# Patient Record
Sex: Female | Born: 1980 | Race: Black or African American | Hispanic: No | Marital: Single | Smoking: Never smoker
Health system: Southern US, Community
[De-identification: ages and names within clinical notes are randomized; demographics above are authoritative.]

## PROBLEM LIST (undated history)

## (undated) DIAGNOSIS — T4145XA Adverse effect of unspecified anesthetic, initial encounter: Secondary | ICD-10-CM

## (undated) DIAGNOSIS — C801 Malignant (primary) neoplasm, unspecified: Secondary | ICD-10-CM

## (undated) DIAGNOSIS — T8859XA Other complications of anesthesia, initial encounter: Secondary | ICD-10-CM

## (undated) DIAGNOSIS — D649 Anemia, unspecified: Secondary | ICD-10-CM

## (undated) DIAGNOSIS — I10 Essential (primary) hypertension: Secondary | ICD-10-CM

## (undated) DIAGNOSIS — R180 Malignant ascites: Secondary | ICD-10-CM

## (undated) HISTORY — DX: Malignant ascites: R18.0

## (undated) HISTORY — DX: Anemia, unspecified: D64.9

## (undated) HISTORY — DX: Essential (primary) hypertension: I10

## (undated) HISTORY — PX: PARACENTESIS: SHX844

---
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
## 2003-10-09 HISTORY — PX: WISDOM TOOTH EXTRACTION: SHX21

## 2008-10-08 DIAGNOSIS — D649 Anemia, unspecified: Secondary | ICD-10-CM

## 2008-10-08 HISTORY — PX: UTERINE FIBROID SURGERY: SHX826

## 2008-10-08 HISTORY — DX: Anemia, unspecified: D64.9

## 2009-07-22 ENCOUNTER — Ambulatory Visit: Payer: Self-pay | Admitting: Oncology

## 2009-07-26 LAB — CMP (CANCER CENTER ONLY)
AST: 22 U/L (ref 11–38)
BUN, Bld: 9 mg/dL (ref 7–22)
Calcium: 8.9 mg/dL (ref 8.0–10.3)
Chloride: 106 mEq/L (ref 98–108)
Creat: 0.8 mg/dl (ref 0.6–1.2)
Total Bilirubin: 0.8 mg/dl (ref 0.20–1.60)

## 2009-07-26 LAB — CBC WITH DIFFERENTIAL (CANCER CENTER ONLY)
BASO#: 0 10*3/uL (ref 0.0–0.2)
EOS%: 4.9 % (ref 0.0–7.0)
Eosinophils Absolute: 0.3 10*3/uL (ref 0.0–0.5)
HCT: 25.8 % — ABNORMAL LOW (ref 34.8–46.6)
HGB: 8 g/dL — ABNORMAL LOW (ref 11.6–15.9)
LYMPH%: 27.9 % (ref 14.0–48.0)
MCH: 21.2 pg — ABNORMAL LOW (ref 26.0–34.0)
MCHC: 30.8 g/dL — ABNORMAL LOW (ref 32.0–36.0)
MCV: 69 fL — ABNORMAL LOW (ref 81–101)
MONO%: 6.3 % (ref 0.0–13.0)
NEUT#: 3.2 10*3/uL (ref 1.5–6.5)
NEUT%: 60.6 % (ref 39.6–80.0)
RBC: 3.76 10*6/uL (ref 3.70–5.32)

## 2009-07-28 LAB — IRON AND TIBC
%SAT: 7 % — ABNORMAL LOW (ref 20–55)
TIBC: 372 ug/dL (ref 250–470)

## 2009-07-28 LAB — PROTEIN ELECTROPHORESIS, SERUM
Alpha-2-Globulin: 12.1 % — ABNORMAL HIGH (ref 7.1–11.8)
Beta Globulin: 7.1 % (ref 4.7–7.2)
Gamma Globulin: 16 % (ref 11.1–18.8)

## 2009-07-28 LAB — FOLATE: Folate: 7.1 ng/mL

## 2009-08-23 ENCOUNTER — Ambulatory Visit: Payer: Self-pay | Admitting: Oncology

## 2009-09-16 LAB — CBC WITH DIFFERENTIAL (CANCER CENTER ONLY)
BASO#: 0 10*3/uL (ref 0.0–0.2)
EOS%: 2.5 % (ref 0.0–7.0)
Eosinophils Absolute: 0.2 10*3/uL (ref 0.0–0.5)
LYMPH#: 1.9 10*3/uL (ref 0.9–3.3)
MCH: 24.7 pg — ABNORMAL LOW (ref 26.0–34.0)
MCV: 79 fL — ABNORMAL LOW (ref 81–101)
MONO#: 0.4 10*3/uL (ref 0.1–0.9)
NEUT#: 3.9 10*3/uL (ref 1.5–6.5)
NEUT%: 61.7 % (ref 39.6–80.0)
RDW: 24.9 % — ABNORMAL HIGH (ref 10.5–14.6)
WBC: 6.4 10*3/uL (ref 3.9–10.0)

## 2009-09-16 LAB — IRON AND TIBC
Iron: 64 ug/dL (ref 42–145)
TIBC: 279 ug/dL (ref 250–470)
UIBC: 215 ug/dL

## 2009-09-16 LAB — FERRITIN: Ferritin: 139 ng/mL (ref 10–291)

## 2009-09-22 ENCOUNTER — Inpatient Hospital Stay (HOSPITAL_COMMUNITY): Admission: RE | Admit: 2009-09-22 | Discharge: 2009-09-24 | Payer: Self-pay | Admitting: Obstetrics and Gynecology

## 2009-09-23 ENCOUNTER — Ambulatory Visit: Payer: Self-pay | Admitting: Oncology

## 2009-10-18 LAB — CBC WITH DIFFERENTIAL (CANCER CENTER ONLY)
BASO#: 0 10*3/uL (ref 0.0–0.2)
BASO%: 0.4 % (ref 0.0–2.0)
EOS%: 4.3 % (ref 0.0–7.0)
HCT: 37.5 % (ref 34.8–46.6)
HGB: 12.2 g/dL (ref 11.6–15.9)
LYMPH%: 28.2 % (ref 14.0–48.0)
MCHC: 32.6 g/dL (ref 32.0–36.0)
MCV: 82 fL (ref 81–101)
NEUT%: 61.6 % (ref 39.6–80.0)
RDW: 18.8 % — ABNORMAL HIGH (ref 10.5–14.6)

## 2009-10-18 LAB — IRON AND TIBC: %SAT: 39 % (ref 20–55)

## 2010-01-25 ENCOUNTER — Ambulatory Visit: Payer: Self-pay | Admitting: Oncology

## 2010-01-31 LAB — CBC WITH DIFFERENTIAL (CANCER CENTER ONLY)
BASO#: 0 10*3/uL (ref 0.0–0.2)
Eosinophils Absolute: 0.3 10*3/uL (ref 0.0–0.5)
HGB: 12.7 g/dL (ref 11.6–15.9)
MCH: 29.6 pg (ref 26.0–34.0)
MCV: 86 fL (ref 81–101)
MONO#: 0.3 10*3/uL (ref 0.1–0.9)
MONO%: 5.2 % (ref 0.0–13.0)
NEUT#: 3.6 10*3/uL (ref 1.5–6.5)
Platelets: 218 10*3/uL (ref 145–400)
RBC: 4.28 10*6/uL (ref 3.70–5.32)
WBC: 6.1 10*3/uL (ref 3.9–10.0)

## 2010-01-31 LAB — IRON AND TIBC
%SAT: 18 % — ABNORMAL LOW (ref 20–55)
Iron: 42 ug/dL (ref 42–145)
UIBC: 196 ug/dL

## 2010-01-31 LAB — FERRITIN: Ferritin: 123 ng/mL (ref 10–291)

## 2011-01-08 LAB — CBC
HCT: 37.3 % (ref 36.0–46.0)
Platelets: 258 10*3/uL (ref 150–400)
RDW: 24.5 % — ABNORMAL HIGH (ref 11.5–15.5)
WBC: 14.5 10*3/uL — ABNORMAL HIGH (ref 4.0–10.5)

## 2011-01-09 LAB — PREGNANCY, URINE: Preg Test, Ur: NEGATIVE

## 2011-01-09 LAB — CBC
HCT: 40.4 % (ref 36.0–46.0)
Hemoglobin: 12.9 g/dL (ref 12.0–15.0)
RBC: 5.01 MIL/uL (ref 3.87–5.11)
WBC: 8.1 10*3/uL (ref 4.0–10.5)

## 2011-03-26 ENCOUNTER — Emergency Department (HOSPITAL_BASED_OUTPATIENT_CLINIC_OR_DEPARTMENT_OTHER)
Admission: EM | Admit: 2011-03-26 | Discharge: 2011-03-26 | Disposition: A | Discharge status: Home / Self Care | Payer: PRIVATE HEALTH INSURANCE | Attending: Emergency Medicine | Admitting: Emergency Medicine

## 2011-03-26 ENCOUNTER — Emergency Department (INDEPENDENT_AMBULATORY_CARE_PROVIDER_SITE_OTHER): Payer: PRIVATE HEALTH INSURANCE

## 2011-03-26 DIAGNOSIS — R079 Chest pain, unspecified: Secondary | ICD-10-CM | POA: Insufficient documentation

## 2011-03-26 DIAGNOSIS — I10 Essential (primary) hypertension: Secondary | ICD-10-CM | POA: Insufficient documentation

## 2011-03-26 LAB — COMPREHENSIVE METABOLIC PANEL
BUN: 12 mg/dL (ref 6–23)
Calcium: 9.5 mg/dL (ref 8.4–10.5)
Creatinine, Ser: 0.8 mg/dL (ref 0.50–1.10)
GFR calc Af Amer: >60 mL/min (ref >60)
Glucose, Bld: 94 mg/dL (ref 70–99)
Total Protein: 7.5 g/dL (ref 6.0–8.3)

## 2011-03-26 LAB — DIFFERENTIAL
Basophils Absolute: 0 10*3/uL (ref 0.0–0.1)
Basophils Relative: 0 % (ref 0–1)
Eosinophils Relative: 1 % (ref 0–5)
Monocytes Absolute: 0.5 10*3/uL (ref 0.1–1.0)
Neutro Abs: 6.8 10*3/uL (ref 1.7–7.7)

## 2011-03-26 LAB — CBC
MCHC: 33.9 g/dL (ref 30.0–36.0)
RDW: 13.3 % (ref 11.5–15.5)
WBC: 8.8 10*3/uL (ref 4.0–10.5)

## 2011-03-26 LAB — CK TOTAL AND CKMB (NOT AT ARMC)
CK, MB: 2.7 ng/mL (ref 0.3–4.0)
Relative Index: 0.6 (ref 0.0–2.5)
Total CK: 478 U/L — ABNORMAL HIGH (ref 7–177)

## 2011-03-29 ENCOUNTER — Encounter: Payer: Self-pay | Admitting: Cardiovascular Disease

## 2011-03-30 ENCOUNTER — Ambulatory Visit (INDEPENDENT_AMBULATORY_CARE_PROVIDER_SITE_OTHER): Payer: PRIVATE HEALTH INSURANCE | Admitting: Cardiovascular Disease

## 2011-03-30 ENCOUNTER — Encounter: Payer: Self-pay | Admitting: Cardiovascular Disease

## 2011-03-30 VITALS — BP 122/92 | HR 64 | Ht 67.0 in | Wt 264.0 lb

## 2011-03-30 DIAGNOSIS — R0789 Other chest pain: Secondary | ICD-10-CM | POA: Insufficient documentation

## 2011-03-30 NOTE — Assessment & Plan Note (Signed)
Her pain is atypical and most likely non-cardiac. Her only risk factor for CAD is HTN. She is a non-smoker. This sounds like a GI related problem. Most likely GERD or possibly hiatal hernia. She will be seen in primary care later today. I have recommended a proton pump inhibitor but we have no samples today. I am asking her to get this in primary care. She may need full GI workup if the PPI does not help her symptoms. No further cardiac workup is necessary.

## 2011-03-30 NOTE — Progress Notes (Signed)
History of Present Illness:30 yo AAF with history of anemia, uterine fibroids, HTN who is here today to establish cardiology care. She was seen in the [facility] on March 26, 2011. She had been at the [employer] working out. She works out every day. While working out she developed chest pain. No SOB, diaphoresis, nausea, vomiting. She got a sensation that she could not swallow. She was taken to the ED for evaluation and had a normal chest xray and normal cardiac enzymes. She has had no recurrence of these symptoms over the last four days. She is a non-smoker. There is no family history of coronary artery disease. No illicit drug use.   Past Medical History  Diagnosis Date  . Hypertension   . Anemia     Past Surgical History  Procedure Date  . Uterine fibroid surgery 2010    Current Outpatient Prescriptions  Medication Sig Dispense Refill  . aspirin 81 MG chewable tablet Chew 81 mg by mouth daily.        . hydrochlorothiazide (,MICROZIDE/HYDRODIURIL,) 12.5 MG capsule Take 25 mg by mouth daily.         No Known Allergies  History   Social History  . Marital Status: Single    Spouse Name: N/A    Number of Children: N/A  . Years of Education: N/A   Occupational History  . Not on file.   Social History Main Topics  . Smoking status: Never Smoker   . Smokeless tobacco: Not on file  . Alcohol Use: No  . Drug Use: No  . Sexually Active: Not on file   Other Topics Concern  . Not on file   Social History Narrative  . No narrative on file    Family History  Problem Relation Age of Onset  . Cancer Father   . Breast cancer Mother   . Depression Father     Review of Systems:  As stated in the HPI and otherwise negative.   BP 122/92  Pulse 64  Ht 5\' 7"  (1.702 m)  Wt 264 lb (119.75 kg)  BMI 41.35 kg/m2  Physical Examination: General: Well developed, well nourished, NAD HEENT: OP clear, mucus membranes moist SKIN: warm, dry. No rashes. Neuro: No focal  deficits Musculoskeletal: Muscle strength 5/5 all ext Psychiatric: Mood and affect normal Neck: No JVD, no carotid bruits, no thyromegaly, no lymphadenopathy. Lungs:Clear bilaterally, no wheezes, rhonci, crackles Cardiovascular: Regular rate and rhythm. No murmurs, gallops or rubs. Abdomen:Soft. Bowel sounds present. Non-tender.  Extremities: No lower extremity edema. Pulses are 2 + in the bilateral DP/PT.  EKG:

## 2012-04-30 ENCOUNTER — Other Ambulatory Visit: Payer: Self-pay | Admitting: Obstetrics and Gynecology

## 2012-04-30 ENCOUNTER — Other Ambulatory Visit (HOSPITAL_COMMUNITY): Payer: PRIVATE HEALTH INSURANCE

## 2012-04-30 DIAGNOSIS — R110 Nausea: Secondary | ICD-10-CM

## 2012-04-30 DIAGNOSIS — R1032 Left lower quadrant pain: Secondary | ICD-10-CM

## 2012-05-02 ENCOUNTER — Ambulatory Visit (HOSPITAL_COMMUNITY)
Admission: RE | Admit: 2012-05-02 | Discharge: 2012-05-02 | Disposition: A | Discharge status: Home / Self Care | Payer: 59 | Source: Ambulatory Visit | Attending: Obstetrics and Gynecology | Admitting: Obstetrics and Gynecology

## 2012-05-02 DIAGNOSIS — R1032 Left lower quadrant pain: Secondary | ICD-10-CM

## 2012-05-02 DIAGNOSIS — R109 Unspecified abdominal pain: Secondary | ICD-10-CM | POA: Insufficient documentation

## 2012-05-02 DIAGNOSIS — R188 Other ascites: Secondary | ICD-10-CM | POA: Insufficient documentation

## 2012-05-02 DIAGNOSIS — R141 Gas pain: Secondary | ICD-10-CM | POA: Insufficient documentation

## 2012-05-02 DIAGNOSIS — R143 Flatulence: Secondary | ICD-10-CM | POA: Insufficient documentation

## 2012-05-02 DIAGNOSIS — R599 Enlarged lymph nodes, unspecified: Secondary | ICD-10-CM | POA: Insufficient documentation

## 2012-05-02 DIAGNOSIS — R142 Eructation: Secondary | ICD-10-CM | POA: Insufficient documentation

## 2012-05-02 DIAGNOSIS — R9389 Abnormal findings on diagnostic imaging of other specified body structures: Secondary | ICD-10-CM | POA: Insufficient documentation

## 2012-05-02 DIAGNOSIS — R110 Nausea: Secondary | ICD-10-CM

## 2012-05-02 MED ORDER — GADOBENATE DIMEGLUMINE 529 MG/ML IV SOLN
20.0000 mL | Freq: Once | INTRAVENOUS | Status: AC | PRN
  Administered 2012-05-02: 20 mL via INTRAVENOUS

## 2012-05-07 ENCOUNTER — Ambulatory Visit: Payer: 59 | Attending: Gynecologic Oncology | Admitting: Gynecologic Oncology

## 2012-05-07 ENCOUNTER — Encounter: Payer: Self-pay | Admitting: Gynecologic Oncology

## 2012-05-07 ENCOUNTER — Other Ambulatory Visit (HOSPITAL_COMMUNITY)
Admission: RE | Admit: 2012-05-07 | Discharge: 2012-05-07 | Disposition: A | Discharge status: Home / Self Care | Payer: 59 | Source: Ambulatory Visit | Attending: Gynecologic Oncology | Admitting: Gynecologic Oncology

## 2012-05-07 ENCOUNTER — Ambulatory Visit: Payer: 59 | Admitting: Lab

## 2012-05-07 VITALS — BP 118/68 | HR 98 | Temp 97.5°F | Resp 22 | Ht 68.19 in | Wt 272.8 lb

## 2012-05-07 DIAGNOSIS — R180 Malignant ascites: Secondary | ICD-10-CM | POA: Insufficient documentation

## 2012-05-07 DIAGNOSIS — R188 Other ascites: Secondary | ICD-10-CM

## 2012-05-07 DIAGNOSIS — D259 Leiomyoma of uterus, unspecified: Secondary | ICD-10-CM | POA: Insufficient documentation

## 2012-05-07 DIAGNOSIS — Z01419 Encounter for gynecological examination (general) (routine) without abnormal findings: Secondary | ICD-10-CM | POA: Insufficient documentation

## 2012-05-07 HISTORY — PX: OTHER SURGICAL HISTORY: SHX169

## 2012-05-07 LAB — HCG, QUANTITATIVE, PREGNANCY: hCG, Beta Chain, Quant, S: <2 m[IU]/mL

## 2012-05-07 LAB — CA 125: CA 125: 55.9 U/mL — ABNORMAL HIGH (ref 0.0–30.2)

## 2012-05-07 NOTE — Progress Notes (Signed)
Consult Note: Gyn-Onc  Melanie Horn 31 y.o. female  CC:  Chief Complaint  Patient presents with  . Ascites    New Consult    HPI: Patient is seen today in consultation at the request of Dr. Cherly Hensen.  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 She underwent a myomectomy in December of 2010 for uterine fibroids. Pathology is available for that and it was consistent with benign myomas.  She did well on even though there was 3 small residual fibroids until January of this year. At that time she began having some increasing dysmenorrhea. An ultrasound was performed to Dr. Cherly Hensen in the office on December 08, 2011. It revealed the endometrium to be 8 mm. She had normal appearing you ovaries. The uterus measured 12.8 x 9.1 x 8 cm with the uterine fibroids each being approximately 3 cm in size. The ovaries were normal and there is no free fluid. She progressively had increasing symptoms of bloating with her menses in this past month noted some fairly significant abdominal girth increase which was new for her. She also began having some left-sided pain as well as some burning and numbness on the right. She began having some shortness of breath with walking which was an issue as she is a Engineer, civil (consulting). Her last cycle was July 10.   With this cycle,  she has had increasing blood clots with spotting. She was seen by Dr. Cherly Hensen at which time Pap smear was performed that was unsatisfactory. She had an MRI of the pelvis done on July 26 which revealed large volume abdominal pelvic ascites with enlarged retroperitoneal and pelvic sidewall lymph nodes. The peritoneal membranes demonstrated diffuse irregular enhancement consistent with extensive omental and peritoneal surface disease. There was marked the regular thickening and expansion of the endometrial cavity with heterogeneous contrast c/w endometrial cancer. It appeared to invade the anterior myometrium. These results  were to discussed with the patient by Dr. Cherly Hensen on Friday. Because of her discomfort she was recommended to go in [facility] emergency room she did. She had a CT scan on July 28 at the emergency room that revealed the mediastinum, the heart and the lungs to be normal. The liver had a steatotic appearance with hepatomegaly with a span of 19 cm. The spleen, pancreas, adrenals, kidneys were normal. She had omental cake large volume of ascites. No dependent positive abnormal soft tissue within the peritoneal cavity. Are normal bilaterally. Within the pelvis she had a cystic lesion of the right adnexa. This was favored to be ovary measuring 4.6 x 2.0 cm. The uterus was enlarged with several fibroids including a dimensions of 11 x 9 x 9 cm. It was the feeling of the emergency room in [facility] that she had metastatic ovarian cancer. She was noted to be anemic with a hematocrit of 28.4. Electrolytes were normal. Tumor markers were drawn but not available to [place] currently.   Review of Systems: By mouth intake secondary to abdominal bloating and early satiety. No significant change in her bowel or bladder habits she has not had a bowel movement since Sunday. She is mostly drinking liquids. She is taking Zofran for nausea. She did have some shortness of breath walking due to her large volume ascites. She did have an episode of chest pain in June of 2012 and was evaluated by cardiologists. Was felt to be most likely noncardiogenic. It was felt was most likely to be GI related.  She is complaining of a sore throat when she swallows.  Current Meds:  Outpatient Encounter Prescriptions as of 05/07/2012  Medication Sig Dispense Refill  . hydrochlorothiazide (,MICROZIDE/HYDRODIURIL,) 12.5 MG capsule Take 25 mg by mouth daily.       Marland Kitchen HYDROcodone-acetaminophen (NORCO/VICODIN) 5-325 MG per tablet Take 2 tablets by mouth every 6 (six) hours as needed.      Marland Kitchen ibuprofen (ADVIL,MOTRIN) 600 MG tablet Take 600 mg by mouth  every 6 (six) hours as needed.      Marland Kitchen ibuprofen (ADVIL,MOTRIN) 800 MG tablet Take 800 mg by mouth every 8 (eight) hours as needed.      . ondansetron (ZOFRAN) 8 MG tablet Take by mouth every 8 (eight) hours as needed.      Marland Kitchen aspirin 81 MG chewable tablet Chew 81 mg by mouth daily.          Allergy: No Known Allergies  Social Hx:   History   Social History  . Marital Status: Single    Spouse Name: N/A    Number of Children: N/A  . Years of Education: N/A   Occupational History  . Not on file.   Social History Main Topics  . Smoking status: Never Smoker   . Smokeless tobacco: Not on file  . Alcohol Use: No  . Drug Use: No  . Sexually Active: Not on file   Other Topics Concern  . Not on file   Social History Narrative  . No narrative on file    Past Surgical Hx:  Past Surgical History  Procedure Date  . Uterine fibroid surgery 2010    Past Medical Hx:  Past Medical History  Diagnosis Date  . Hypertension   . Anemia     Family Hx:  Family History  Problem Relation Age of Onset  . Cancer Father   . Depression Father   . Breast cancer Mother   . Hypertension Mother   . Diabetes Brother     Vitals:  Blood pressure 118/68, pulse 98, temperature 97.5 F (36.4 C), resp. rate 22, height 5' 8.19" (1.732 m), weight 272 lb 12.8 oz (123.741 kg), last menstrual period 04/16/2012.  Physical Exam: Well-nourished well-developed female in no acute distress.  Neck: Supple, no lymphadenopathy no thyromegaly.  Lungs: Shallow breath sounds clear to auscultation but he.  Cardiovascular: Tachycardic regular rhythm. No murmur.  Abdomen: Morbidly obese. Significantly distended with ascites positive fluid wave. No appreciable masses but exam is limited by ascites in habitus.  Groins: No lymphadenopathy.  Extremities: No edema.  Pelvic: External genitalia within normal limits. The cervix is nulliparous. There's a physiologic discharge was slight menstrual flow. There are  no visible lesions. ThinPrep Pap smear was submitted without difficulty. After obtaining the patient's verbal consent an endometrial biopsy was performed. There was no need for tenaculum. The uterus sounded to 10 cm. Large amount of blood and bright tissue were obtained. She tolerated it well. Bimanual examination is limited secondary to her habitus and the massive ascites. The cervix is palpably normal. There is no nodularity in the rectovaginal septum.  Assessment/Plan:  31 year old gravida 0 with a long history of uterine fibroids who has imaging consistent with carcinomatosis and ascites. The etiology is unclear. 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 I had a discussion with the patient her mother and several friends and her sister today. I will  proceed with the following.  #1: Followup in results of her Pap smear enter endometrial biopsy these are being sent rash.  #2: She will have tumor markers including a CA 125, CEA, AFP, HCG., LDH  #3: She is scheduled for a high-volume paracentesis on Friday in radiology. We will have the volume of fluid sent to pathology so the cell block and created and we can try to figure out the etiology of her malignancy.   We have in a very generic terms discussed the possibility of surgery with Dr. Loree Fee on August 20. I discussed with her that a paracentesis will make her feel better. We did discuss if this is a gynecologic primary that she will require surgery including potentially a hysterectomy. She has been thinking about this and will be fine if she had to go undergo definitive surgery and would not be able to have children. She knows that we will be calling her as we begin getting information back with results so that we can narrow down the diagnosis. She has asked that we keep Dr. Cherly Hensen informed. I discussed  with her that we will do so and that Dr. Cherly Hensen also can review the results in the computer system.  Her questions as well as those of her family and friends were answered to their satisfaction. She has her business card and will call us if there's any change in her status prior to be able to communicate with her.  She is pleased with her consultation today and that plans are being developed. Cleda Mccreedy A., MD 05/07/2012, 4:21 PM

## 2012-05-07 NOTE — Patient Instructions (Signed)
Return to radiology for paracentesis on Friday.

## 2012-05-08 ENCOUNTER — Telehealth: Payer: Self-pay | Admitting: *Deleted

## 2012-05-08 NOTE — Telephone Encounter (Signed)
Patient notified of lab and preliminary (stains pending) Path results.  Surgery to be scheduled 05/27/12.  She is in agrement. Same noted to Dr Duard Brady

## 2012-05-09 ENCOUNTER — Ambulatory Visit (HOSPITAL_COMMUNITY)
Admission: RE | Admit: 2012-05-09 | Discharge: 2012-05-09 | Disposition: A | Discharge status: Home / Self Care | Payer: 59 | Source: Ambulatory Visit | Attending: Gynecologic Oncology | Admitting: Gynecologic Oncology

## 2012-05-09 VITALS — BP 136/73

## 2012-05-09 DIAGNOSIS — R180 Malignant ascites: Secondary | ICD-10-CM | POA: Insufficient documentation

## 2012-05-09 DIAGNOSIS — R188 Other ascites: Secondary | ICD-10-CM

## 2012-05-09 DIAGNOSIS — C801 Malignant (primary) neoplasm, unspecified: Secondary | ICD-10-CM | POA: Insufficient documentation

## 2012-05-09 NOTE — Procedures (Signed)
US guided diagnostic/therapeutic paracentesis performed yielding 6 liters turbid, amber fluid. The fluid was submitted for cytology. No immediate complications.

## 2012-05-13 ENCOUNTER — Encounter: Payer: Self-pay | Admitting: Internal Medicine

## 2012-05-13 ENCOUNTER — Ambulatory Visit (INDEPENDENT_AMBULATORY_CARE_PROVIDER_SITE_OTHER): Payer: 59 | Admitting: Internal Medicine

## 2012-05-13 VITALS — BP 98/60 | HR 110 | Temp 97.7°F | Ht 68.0 in | Wt 259.6 lb

## 2012-05-13 DIAGNOSIS — D649 Anemia, unspecified: Secondary | ICD-10-CM

## 2012-05-13 DIAGNOSIS — G4700 Insomnia, unspecified: Secondary | ICD-10-CM | POA: Insufficient documentation

## 2012-05-13 DIAGNOSIS — R180 Malignant ascites: Secondary | ICD-10-CM

## 2012-05-13 DIAGNOSIS — I10 Essential (primary) hypertension: Secondary | ICD-10-CM

## 2012-05-13 MED ORDER — ZOLPIDEM TARTRATE 10 MG PO TABS: ORAL_TABLET | ORAL | Status: DC

## 2012-05-13 MED ORDER — PHENOL 1.4 % MT LIQD: 1.0000 | OROMUCOSAL | Status: AC | PRN

## 2012-05-13 MED ORDER — HYDROCHLOROTHIAZIDE 12.5 MG PO CAPS: 25.0000 mg | ORAL_CAPSULE | Freq: Every day | ORAL | Status: DC

## 2012-05-13 NOTE — Progress Notes (Signed)
  Subjective:    Patient ID: Melanie Horn, female    DOB: [birth year], 31 y.o.   MRN: [id number]  HPI  This is my first visit with MS Baccari. She is here with her aunt. Please see the A&P for the status of the pt's chronic medical problems.   Review of Systems  Constitutional: Positive for activity change, appetite change and fatigue. Negative for unexpected weight change.  HENT: Positive for trouble swallowing. Negative for congestion, rhinorrhea, sneezing and postnasal drip.   Eyes: Negative for itching.  Respiratory: Negative for cough, choking, chest tightness and shortness of breath.   Cardiovascular: Negative for chest pain and leg swelling.  Gastrointestinal: Positive for nausea, constipation and abdominal distention. Negative for vomiting and diarrhea.  Genitourinary: Positive for urgency, vaginal bleeding and menstrual problem. Negative for dysuria, frequency and hematuria.  Musculoskeletal: Negative for back pain.  Skin: Negative for color change, pallor, rash and wound.  Neurological: Negative for dizziness, light-headedness and headaches.  Psychiatric/Behavioral: Positive for disturbed wake/sleep cycle.       Objective:   Physical Exam  Constitutional: She is oriented to person, place, and time. She appears well-developed and well-nourished. No distress.  HENT:  Head: Normocephalic and atraumatic.  Right Ear: External ear normal.  Left Ear: External ear normal.  Nose: Nose normal.  Mouth/Throat: Oropharynx is clear and moist.       Tongue has dark coating to it. No thrush.  Eyes: Conjunctivae and EOM are normal.  Cardiovascular: Normal rate, regular rhythm, normal heart sounds and intact distal pulses.   No murmur heard. Pulmonary/Chest: Effort normal and breath sounds normal. No respiratory distress.  Abdominal: Soft. Bowel sounds are normal.  Musculoskeletal: Normal range of motion. She exhibits edema.       Trace edema B  Neurological: She is alert and oriented  to person, place, and time.  Skin: Skin is warm and dry. No rash noted. She is not diaphoretic. No erythema. No pallor.       + ecchymosis  Psychiatric: She has a normal mood and affect. Her behavior is normal. Judgment and thought content normal.          Assessment & Plan:

## 2012-05-13 NOTE — Patient Instructions (Addendum)
Try chloraseptic spray for your throat.  Do not take your HCTZ. Keep an eye on your blood pressure and let me know if it increases to over 140/90.  Try the Ambien for sleep. Start with 1/2 pill and if not helpful, try one full pill.  Call and schedule an appointment once your cancer work up is complete.   Call anytime you have an issue.

## 2012-05-14 ENCOUNTER — Other Ambulatory Visit: Payer: Self-pay | Admitting: Radiology

## 2012-05-14 ENCOUNTER — Other Ambulatory Visit: Payer: Self-pay | Admitting: *Deleted

## 2012-05-14 ENCOUNTER — Telehealth: Payer: Self-pay | Admitting: *Deleted

## 2012-05-14 DIAGNOSIS — R180 Malignant ascites: Secondary | ICD-10-CM

## 2012-05-14 NOTE — Telephone Encounter (Signed)
Tc from pt with c/o return of ascites.  Explored surgical options @ [facility] and patient prefers to stay here in [place].  D/W dr Duard Brady and arrangements made for pt to have an Aspira drain placed tomorrow.

## 2012-05-15 ENCOUNTER — Encounter (HOSPITAL_COMMUNITY): Payer: Self-pay

## 2012-05-15 ENCOUNTER — Encounter: Payer: Self-pay | Admitting: Internal Medicine

## 2012-05-15 ENCOUNTER — Other Ambulatory Visit: Payer: Self-pay | Admitting: Gynecologic Oncology

## 2012-05-15 ENCOUNTER — Ambulatory Visit (HOSPITAL_COMMUNITY)
Admission: RE | Admit: 2012-05-15 | Discharge: 2012-05-15 | Disposition: A | Discharge status: Home / Self Care | Payer: 59 | Source: Ambulatory Visit | Attending: Gynecologic Oncology | Admitting: Gynecologic Oncology

## 2012-05-15 VITALS — BP 122/70

## 2012-05-15 DIAGNOSIS — I10 Essential (primary) hypertension: Secondary | ICD-10-CM | POA: Insufficient documentation

## 2012-05-15 DIAGNOSIS — R180 Malignant ascites: Secondary | ICD-10-CM

## 2012-05-15 DIAGNOSIS — D649 Anemia, unspecified: Secondary | ICD-10-CM | POA: Insufficient documentation

## 2012-05-15 DIAGNOSIS — C55 Malignant neoplasm of uterus, part unspecified: Secondary | ICD-10-CM | POA: Insufficient documentation

## 2012-05-15 LAB — CBC WITH DIFFERENTIAL/PLATELET
Eosinophils Relative: 0 % (ref 0–5)
HCT: 28.2 % — ABNORMAL LOW (ref 36.0–46.0)
Lymphs Abs: 1.6 10*3/uL (ref 0.7–4.0)
MCV: 67.8 fL — ABNORMAL LOW (ref 78.0–100.0)
Monocytes Relative: 5 % (ref 3–12)
Platelets: 891 10*3/uL — ABNORMAL HIGH (ref 150–400)
RBC: 4.16 MIL/uL (ref 3.87–5.11)
WBC: 26.3 10*3/uL — ABNORMAL HIGH (ref 4.0–10.5)

## 2012-05-15 LAB — PROTIME-INR
INR: 1.04 (ref 0.00–1.49)
Prothrombin Time: 13.8 seconds (ref 11.6–15.2)

## 2012-05-15 MED ORDER — CEFAZOLIN SODIUM 1-5 GM-% IV SOLN
1.0000 g | Freq: Once | INTRAVENOUS | Status: DC
  Filled 2012-05-15: qty 50

## 2012-05-15 MED ORDER — CEFAZOLIN SODIUM-DEXTROSE 2-3 GM-% IV SOLR
2.0000 g | Freq: Once | INTRAVENOUS | Status: DC
  Filled 2012-05-15: qty 50

## 2012-05-15 MED ORDER — SODIUM CHLORIDE 0.9 % IV SOLN
Freq: Once | INTRAVENOUS | Status: AC
  Administered 2012-05-15: 13:00:00 via INTRAVENOUS

## 2012-05-15 NOTE — Assessment & Plan Note (Addendum)
I reviewed what I had learned from a chart review with the pt to ensure that I had the details correct. The primary is assumed to be ovarian or endometrial. She has a surgery planned Aug 20th for a TAH +/- BSO. Surgery has already discussed fertility with her.   Due to the ascites, she has DOE, tachycardia, and ABD pain. She had 6 L removed during the Aug paracentesis and was comfortable for 2 days but the fluid is slowly re accumulating. She is using a WC for long distances 2/2 to the DOE. She is troubled by the tachycardia that has been present for 3 weeks. She can feel her heart beat even at rest. I assume that it is resulting from the abd distension compromising the lung fxn resulting in increased WOB and tachycardia. The other concern would be a PE as she has a malignancy. However, no personal or fam hx of PE. No unilateral leg edema. No CP. No sudden onset. Other more likely explanation. She has 1.5 points on the Well's criteria putting her at low prob. Therefore, will not pursue W/U. She has never had a TSH. I offered to draw it today but she preferred to consolidate it with a future lab draw to reduce the number of venipunctures. She is using hydrocodone for the pain.  She has developed constipation 2/2 the opioid. She is on Colace and had 2 small BM's yesterday. She has also increased her fluid intake to help with the constipation.  She has nausea and has only vomited once. She has phenergan that she uses at night as it is sedating and zofran for the day.   She is on [facility]. She is trying to obtain short term disability. She intends to return to work once her medical issues are stable.

## 2012-05-15 NOTE — Assessment & Plan Note (Signed)
She was Rx'd oral iron in March but stopped taking it 2/2 N/V. Her HgB is stable and her ferritin has increased from a low of 7 in 2010 to 123 2 years ago. She is undergoing gyn surgery and will need to monitor her HgB afterwards.

## 2012-05-15 NOTE — Assessment & Plan Note (Signed)
BP Readings from Last 3 Encounters:  05/13/12 98/60  05/09/12 136/73  05/07/12 118/68   She has been on HCTZ and her BP has always been well controlled. Her BP today is low although she is asymptomatic and has been out of her HCTZ for approximately one month. Therefore, I have asked her not to take her HCTZ until further notice. She will have freq interactions with the health care system over the next several months and if her BP increases, it will be picked up quickly. I did give her an RZ for her HCTZ so that she will have it when and if her BP increases enough that she needs to resume it.  Her prior MD required visits q 3 months to monitor her BP. 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

## 2012-05-15 NOTE — H&P (Signed)
Chief Complaint: Malignant ascites Referring Physician:Dr. Duard Brady HPI: Melanie Horn is an 31 y.o. female who unfortunately is diagnosis with recurrent malignant ascites and gynecological malignancy. She has had paracentesis and due to her recurrence, she is felt to be a candidate for Aspira/PLeurx drain placement. She is otherwise ok without other c/o.  Past Medical History:  Past Medical History  Diagnosis Date  . Hypertension     Treated with HCTZ monotherapy and well controlled  . Anemia 2010    HgB baseline about 12 - 13. Fe Deficiency, ferritin 7 07/2009. Cannot tolerate oral iron. Ferritin has increased to normal.  . Ascites, malignant     Work underway 03/14/12    Past Surgical History:  Past Surgical History  Procedure Date  . Uterine fibroid surgery 03-14-2009    myomectomy with benign path    Family History:  Family History  Problem Relation Age of Onset  . Cancer Father     AML. Died 03/14/2002  . Depression Father   . Breast cancer Maternal Grandmother     Died 03/14/82.  Marland Kitchen Hypertension Mother   . Diabetes Brother     Onset prior to 30's.  . Breast cancer Maternal Aunt     Maternal GREAT aunt.   . Pancreatic cancer Maternal Uncle     Social History:  reports that she has never smoked. She does not have any smokeless tobacco history on file. She reports that she does not drink alcohol or use illicit drugs.  Allergies: No Known Allergies  Medications: Colace, Microzide, Zofran, Ambien, Phenergan  Please HPI for pertinent positives, otherwise complete 10 system ROS negative.  Physical Exam: Blood pressure 118/84, pulse 107, temperature 97 F (36.1 C), temperature source Oral, resp. rate 20, last menstrual period 04/16/2012, SpO2 97.00%. There is no height or weight on file to calculate BMI.   General Appearance:  Alert, cooperative, no distress, appears stated age, morbidly obese  Head:  Normocephalic, without obvious abnormality, atraumatic  ENT: Unremarkable  Neck:  Supple, symmetrical, trachea midline, no adenopathy, thyroid: not enlarged, symmetric, no tenderness/mass/nodules  Lungs:   Clear to auscultation bilaterally, no w/r/r, respirations unlabored without use of accessory muscles.  Heart:  Regular rate and rhythm, S1, S2 normal, no murmur, rub or gallop. Carotids 2+ without bruit.  Abdomen:   Soft, non-tender, distended with ascites.  Extremities: Extremities normal, atraumatic, no cyanosis or edema  Neurologic: Normal affect, no gross deficits.   Results for orders placed during the hospital encounter of 05/15/12 (from the past 48 hour(s))  APTT     Status: Abnormal   Collection Time   05/15/12 12:42 PM      Component Value Range Comment   aPTT 22 (*) 24 - 37 seconds   CBC WITH DIFFERENTIAL     Status: Abnormal   Collection Time   05/15/12 12:42 PM      Component Value Range Comment   WBC 26.3 (*) 4.0 - 10.5 K/uL    RBC 4.16  3.87 - 5.11 MIL/uL    Hemoglobin 8.6 (*) 12.0 - 15.0 g/dL    HCT 96.0 (*) 45.4 - 46.0 %    MCV 67.8 (*) 78.0 - 100.0 fL    MCH 20.7 (*) 26.0 - 34.0 pg    MCHC 30.5  30.0 - 36.0 g/dL    RDW 09.8 (*) 11.9 - 15.5 %    Platelets 891 (*) 150 - 400 K/uL    Neutrophils Relative 89 (*) 43 - 77 %  Lymphocytes Relative 6 (*) 12 - 46 %    Monocytes Relative 5  3 - 12 %    Eosinophils Relative 0  0 - 5 %    Basophils Relative 0  0 - 1 %    Neutro Abs 23.4 (*) 1.7 - 7.7 K/uL    Lymphs Abs 1.6  0.7 - 4.0 K/uL    Monocytes Absolute 1.3 (*) 0.1 - 1.0 K/uL    Eosinophils Absolute 0.0  0.0 - 0.7 K/uL    Basophils Absolute 0.0  0.0 - 0.1 K/uL    RBC Morphology POLYCHROMASIA PRESENT      WBC Morphology TOXIC GRANULATION      Smear Review        Value: PLATELET CLUMPS NOTED ON SMEAR, COUNT APPEARS ADEQUATE  PROTIME-INR     Status: Normal   Collection Time   05/15/12 12:42 PM      Component Value Range Comment   Prothrombin Time 13.8  11.6 - 15.2 seconds    INR 1.04  0.00 - 1.49    No results  found.  Assessment/Plan Malignant ascites of gynecological source For Aspira drain placement. Labs show elevated WBC of 26k. No obvious source of infection but very concerning to place an indwelling drain. Dr. Grace Isaac contacted Telford Nab who agreed that deferring procedure until further workup for poss infection. The Aspira drain can be rescheduled once stability established. We are going to proceed with large volume paracentesis instead for therapeutic purposes, please refer to that note for details.  Brayton El PA-C 05/15/2012, 2:42 PM

## 2012-05-15 NOTE — Assessment & Plan Note (Signed)
She has never had trouble with insomnia until her current medical issues. She has used phenergan and vicoden (both for their sedating properties) to help sleep. Initially they worked but are no longer. She is unable to get a full nights sleep and is fatigued. We discussed various treatments including elavil and ambien and decided to start a trial of ambien. I gave her 10 mg tablets and asked her to start with 5 mg. I f 5 mg doesn't work, she may take 10 mg. I instructed her that she should not get OOB after taking this med as it can cause unsteadiness and lead to falls.

## 2012-05-15 NOTE — Procedures (Signed)
Successful US guided paracentesis from RLQ.  Yielded 6.2L of dark amber colored fluid.  No immediate complications.  Pt tolerated well.   Specimen was not sent for labs.  Brayton El PA-C 05/15/2012 3:39 PM

## 2012-05-16 ENCOUNTER — Encounter (HOSPITAL_COMMUNITY): Payer: Self-pay | Admitting: Pharmacy Technician

## 2012-05-16 ENCOUNTER — Other Ambulatory Visit: Payer: Self-pay | Admitting: *Deleted

## 2012-05-16 ENCOUNTER — Other Ambulatory Visit: Payer: 59

## 2012-05-16 DIAGNOSIS — C541 Malignant neoplasm of endometrium: Secondary | ICD-10-CM

## 2012-05-16 NOTE — Progress Notes (Signed)
[facility] work.

## 2012-05-19 ENCOUNTER — Ambulatory Visit: Payer: 59 | Admitting: Lab

## 2012-05-19 DIAGNOSIS — C541 Malignant neoplasm of endometrium: Secondary | ICD-10-CM

## 2012-05-19 LAB — CBC WITH DIFFERENTIAL/PLATELET
BASO%: 0.4 % (ref 0.0–2.0)
EOS%: 0.2 % (ref 0.0–7.0)
HGB: 8.7 g/dL — ABNORMAL LOW (ref 11.6–15.9)
MCH: 19.7 pg — ABNORMAL LOW (ref 25.1–34.0)
MCHC: 29.4 g/dL — ABNORMAL LOW (ref 31.5–36.0)
RDW: 17.9 % — ABNORMAL HIGH (ref 11.2–14.5)
lymph#: 1.3 10*3/uL (ref 0.9–3.3)

## 2012-05-21 ENCOUNTER — Other Ambulatory Visit: Payer: Self-pay | Admitting: *Deleted

## 2012-05-21 ENCOUNTER — Ambulatory Visit (HOSPITAL_COMMUNITY)
Admission: RE | Admit: 2012-05-21 | Discharge: 2012-05-21 | Disposition: A | Discharge status: Home / Self Care | Payer: 59 | Source: Ambulatory Visit | Attending: Gynecologic Oncology | Admitting: Gynecologic Oncology

## 2012-05-21 DIAGNOSIS — R180 Malignant ascites: Secondary | ICD-10-CM | POA: Insufficient documentation

## 2012-05-21 DIAGNOSIS — C55 Malignant neoplasm of uterus, part unspecified: Secondary | ICD-10-CM

## 2012-05-21 NOTE — Procedures (Signed)
Successful US guided paracentesis from right lateral abdomen.  Yielded 3.6L of dark yellow fluid.  No immediate complications.  Pt tolerated well.   Specimen was not sent for labs.  Brayton El PA-C 05/21/2012 2:27 PM

## 2012-05-22 ENCOUNTER — Encounter (HOSPITAL_COMMUNITY)
Admission: RE | Admit: 2012-05-22 | Discharge: 2012-05-22 | Disposition: A | Discharge status: Home / Self Care | Payer: 59 | Source: Ambulatory Visit | Attending: Obstetrics & Gynecology | Admitting: Obstetrics & Gynecology

## 2012-05-22 ENCOUNTER — Encounter (HOSPITAL_COMMUNITY): Payer: Self-pay

## 2012-05-22 DIAGNOSIS — Z01812 Encounter for preprocedural laboratory examination: Secondary | ICD-10-CM | POA: Insufficient documentation

## 2012-05-22 DIAGNOSIS — C55 Malignant neoplasm of uterus, part unspecified: Secondary | ICD-10-CM | POA: Insufficient documentation

## 2012-05-22 HISTORY — DX: Adverse effect of unspecified anesthetic, initial encounter: T41.45XA

## 2012-05-22 HISTORY — DX: Malignant (primary) neoplasm, unspecified: C80.1

## 2012-05-22 HISTORY — DX: Other complications of anesthesia, initial encounter: T88.59XA

## 2012-05-22 LAB — COMPREHENSIVE METABOLIC PANEL
AST: 19 U/L (ref 0–37)
Albumin: 2.1 g/dL — ABNORMAL LOW (ref 3.5–5.2)
Alkaline Phosphatase: 107 U/L (ref 39–117)
Chloride: 91 mEq/L — ABNORMAL LOW (ref 96–112)
Potassium: 4.3 mEq/L (ref 3.5–5.1)
Sodium: 128 mEq/L — ABNORMAL LOW (ref 135–145)
Total Bilirubin: 0.1 mg/dL — ABNORMAL LOW (ref 0.3–1.2)

## 2012-05-22 LAB — CBC WITH DIFFERENTIAL/PLATELET
Basophils Relative: 0 % (ref 0–1)
Eosinophils Relative: 1 % (ref 0–5)
Hemoglobin: 8.8 g/dL — ABNORMAL LOW (ref 12.0–15.0)
Lymphocytes Relative: 5 % — ABNORMAL LOW (ref 12–46)
Neutrophils Relative %: 89 % — ABNORMAL HIGH (ref 43–77)
RBC: 4.32 MIL/uL (ref 3.87–5.11)

## 2012-05-22 LAB — SURGICAL PCR SCREEN: Staphylococcus aureus: NEGATIVE

## 2012-05-22 LAB — HCG, SERUM, QUALITATIVE: Preg, Serum: NEGATIVE

## 2012-05-22 LAB — PREPARE RBC (CROSSMATCH)

## 2012-05-22 NOTE — Progress Notes (Signed)
Lab called me with critical Calcium level-Nancy Wilkerson,RN notified at 5:17 pm and given results-to get in touch with Dr. Duard Brady and call me back with any intervention needed if necessary.Sticker in progress note in chart.

## 2012-05-22 NOTE — Progress Notes (Signed)
AT 543 pm,Nancy Wilkerson,RN called me back after talking to Dr. Stanford Breed- he was made aware of patient coming to ER at [facility]. He and his fellow Dr. Warner Mccreedy will receive patient for GYN-Oncology services.

## 2012-05-22 NOTE — Progress Notes (Signed)
Critical lab value -platelets called to me-and called to Dagmar Hait, RN with no new orders received.Sticker in progress notes.

## 2012-05-22 NOTE — Patient Instructions (Addendum)
21 TENE GATO  05/22/2012   Your procedure is scheduled on:  Tuesday 05/27/2012 at 0715 am  Report to [facility] at 0515 AM.  Call this number if you have problems the morning of surgery: [phone]   Remember:   Do not eat food:After Midnight.  May have clear liquids:until Midnight .  Clear liquids include soda, tea, black coffee, apple or grape juice, broth.  Take these medicines the morning of surgery with A SIP OF WATER: Hydrocodone-acetaminophen if needed   Do not wear jewelry, make-up or nail polish.  Do not wear lotions, powders, or perfumes.   Do not shave 48 hours prior to surgery. Men may shave face and neck.  Do not bring valuables to the hospital.  Contacts, dentures or bridgework may not be worn into surgery.  Leave suitcase in the car. After surgery it may be brought to your room.  For patients admitted to the hospital, checkout time is 11:00 AM the day of discharge.       Special Instructions: CHG Shower Use Special Wash: 1/2 bottle night before surgery and 1/2 bottle morning of surgery.   Please read over the following fact sheets that you were given: MRSA Information, Sleep apnea sheet, Blood Transfusion sheet, Incentive Spirometry sheet, instructed on leg exercises and cough and deep breath exercses                 Please call me ,Telford Nab.Georgeanna Lea, RN, BSN at [phone] if any questions.

## 2012-05-22 NOTE — Progress Notes (Signed)
At 531 pm ,Melanie Horn called me to inform me she Had talked to patient and per Dr. Denman George recommendation, patient needed to go to the ER at [facility] ,[place] to be admitted to GYN Oncology Unit.

## 2012-05-22 NOTE — Progress Notes (Signed)
Melanie Horn called patient around 531pm and informed her she needed to go to ER at [facility] and be admitted to Dr. Stanford Breed.

## 2012-05-22 NOTE — Pre-Procedure Instructions (Signed)
Reviewed with patient per-op instructions using Teach back method.

## 2012-05-22 NOTE — Progress Notes (Signed)
Faxed 28 pages to Dr. Warner Mccreedy at [facility]-including H&P from Dr. Duard Brady, Pathology reports, Radiology reports, labs and EKG.  [facility] and they received FAXES.

## 2012-05-23 LAB — PATHOLOGIST SMEAR REVIEW

## 2012-05-25 LAB — BODY FLUID CULTURE

## 2012-05-27 ENCOUNTER — Encounter (HOSPITAL_COMMUNITY): Admission: RE | Payer: Self-pay | Source: Ambulatory Visit

## 2012-05-27 ENCOUNTER — Ambulatory Visit (HOSPITAL_COMMUNITY): Admission: RE | Admit: 2012-05-27 | Payer: 59 | Source: Ambulatory Visit | Admitting: Obstetrics & Gynecology

## 2012-05-27 SURGERY — HYSTERECTOMY, ABDOMINAL
Anesthesia: General

## 2012-05-31 LAB — TYPE AND SCREEN
Antibody Screen: NEGATIVE
Unit division: 0

## 2012-06-19 ENCOUNTER — Ambulatory Visit: Payer: 59 | Admitting: Gynecologic Oncology

## 2012-07-08 DEATH — deceased

## 2013-07-08 DEATH — deceased

## 2014-01-12 IMAGING — CR DG CHEST 2V
3 series · 3 of 3 positions shown · non-contrast
Comparison: 03/26/2011

CLINICAL DATA: Shortness of breath.  Uterine cancer.  Rule out
pleural fluid.

CHEST - 2 VIEW

[w chest lat *]
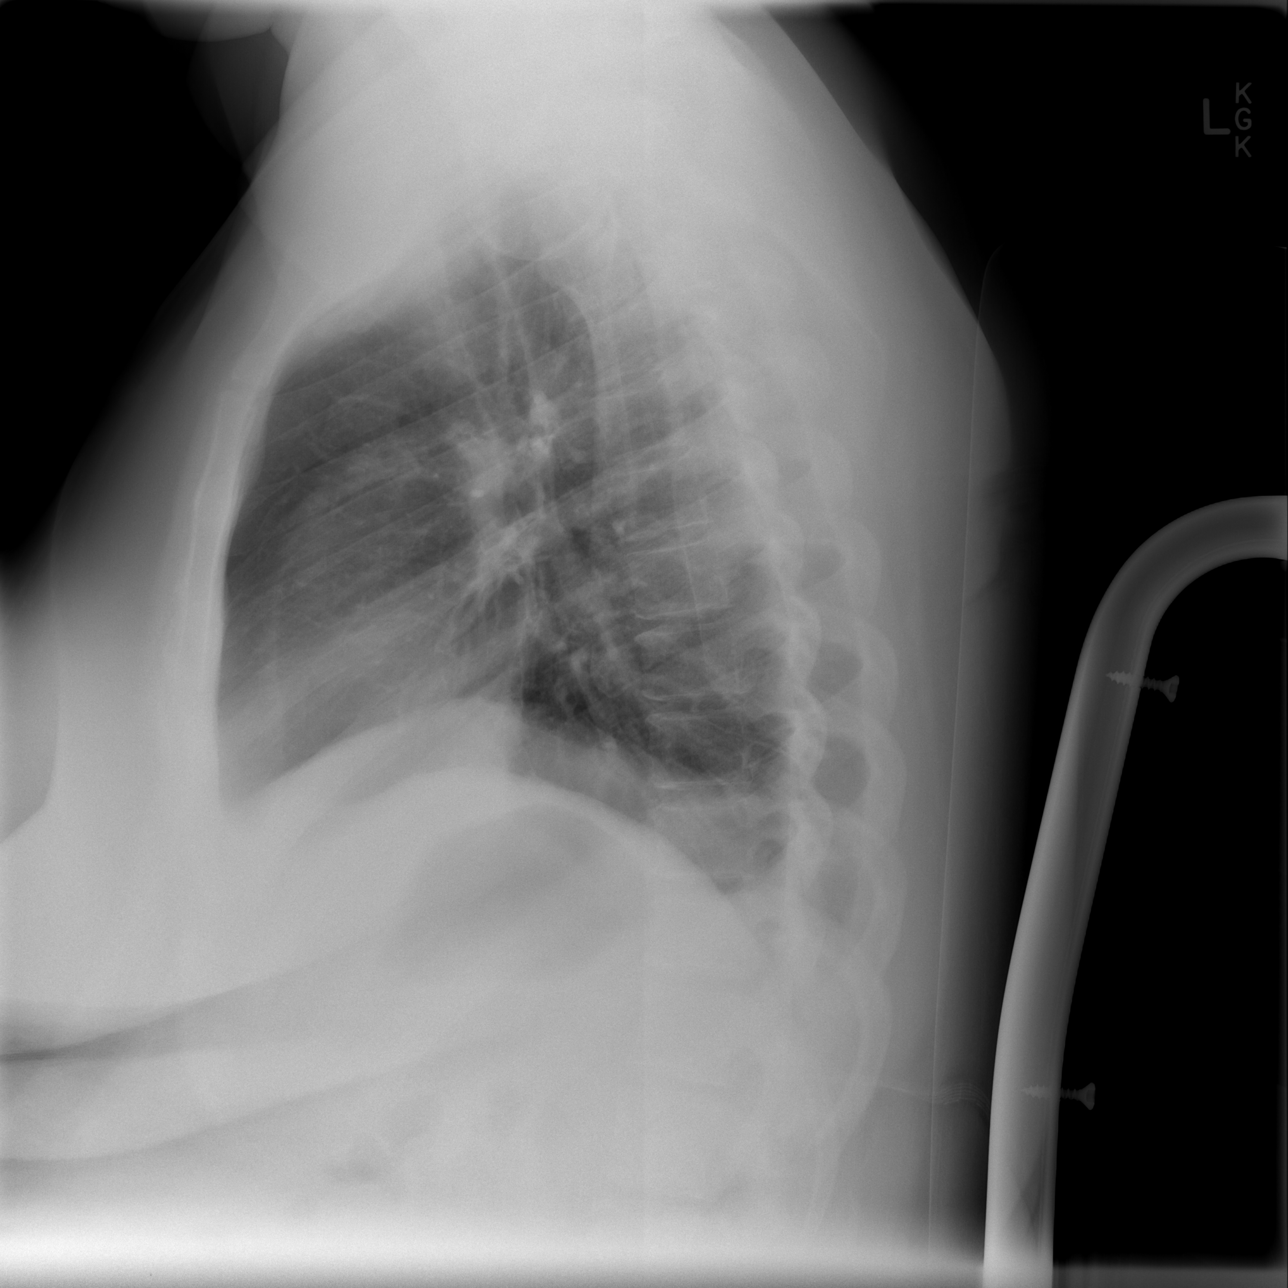

[view not recorded (1 of 2)]
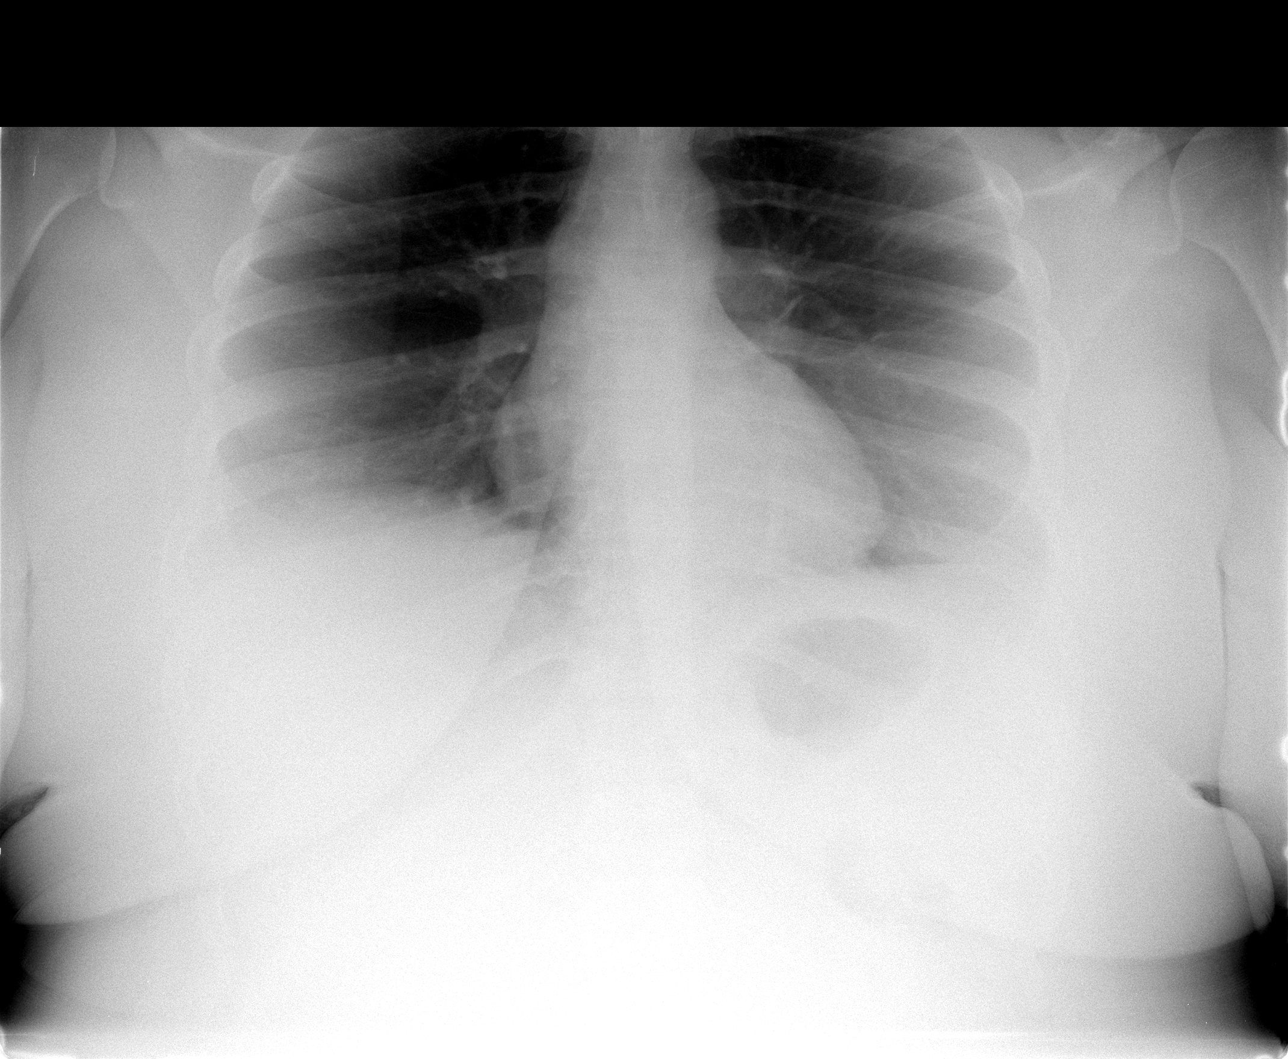

[view not recorded (2 of 2)]
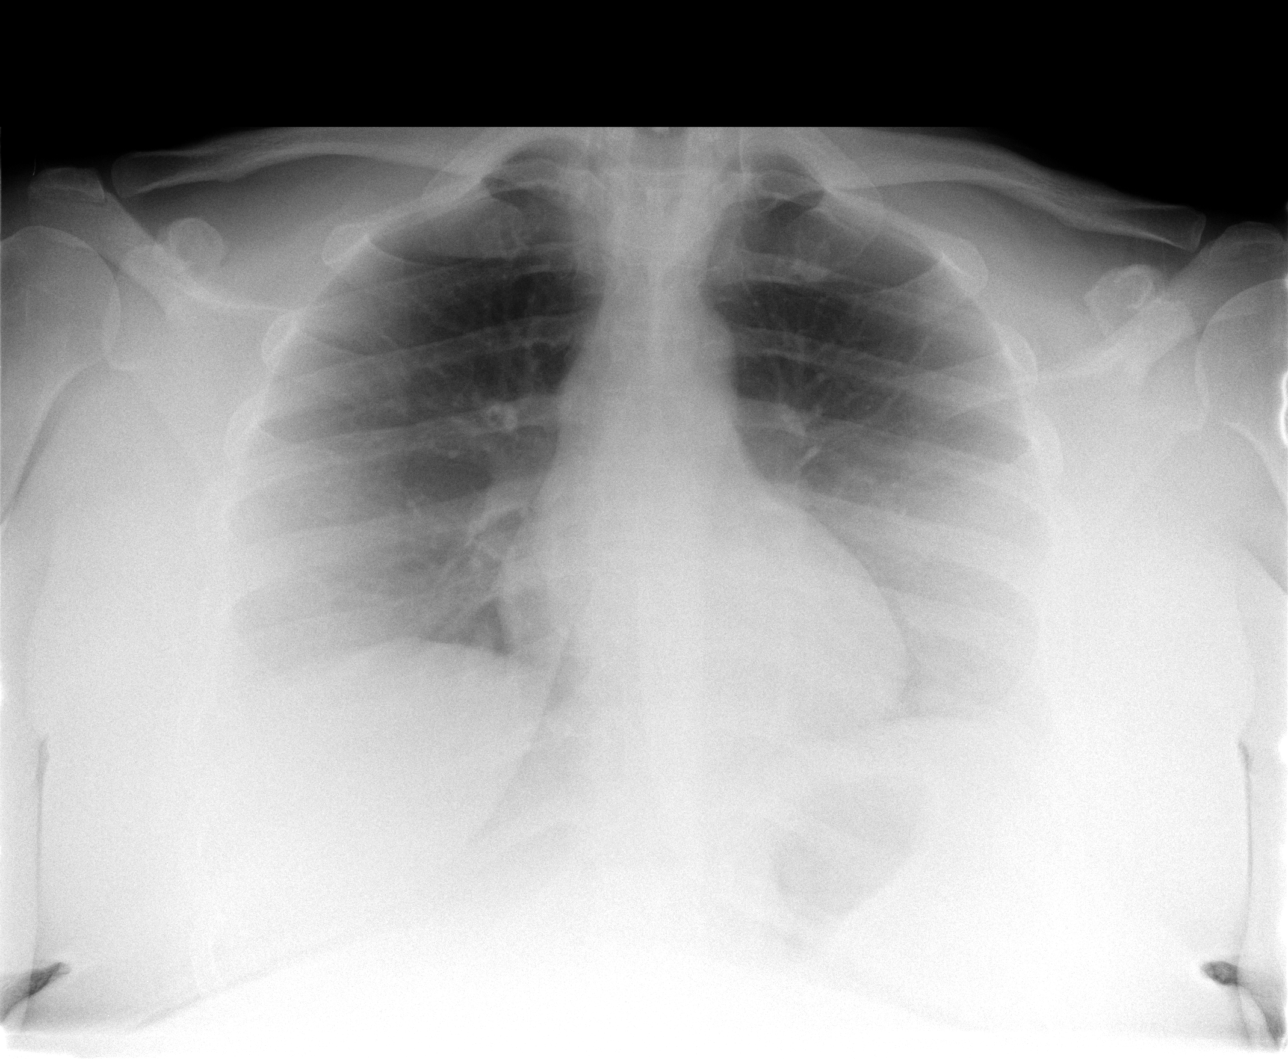

[3 of 3 positions shown; findings below may reference images not displayed]

FINDINGS: The frontal view is degraded by AP portable technique and
patient body habitus. Midline trachea.  Normal heart size for level
of inspiration.  There may be trace right-sided pleural fluid or
thickening on the lateral view. No pneumothorax.  Low lung volumes
with resultant pulmonary interstitial prominence.  Mild bibasilar
atelectasis.
IMPRESSION: No acute cardiopulmonary disease.  Possible trace right-sided
pleural fluid or thickening.  Degraded frontal exam.

## 2014-01-12 IMAGING — US US PARACENTESIS
1 series · 5 of 5 positions shown · non-contrast
Comparison: Previous paracentesis

CLINICAL DATA: Uterine sarcoma, recurrent malignant ascites

ULTRASOUND GUIDED PARACENTESIS

[Series 1: us paracentesis · 0.39mm/px · 5 of 5 slices shown]
[im 1/5]
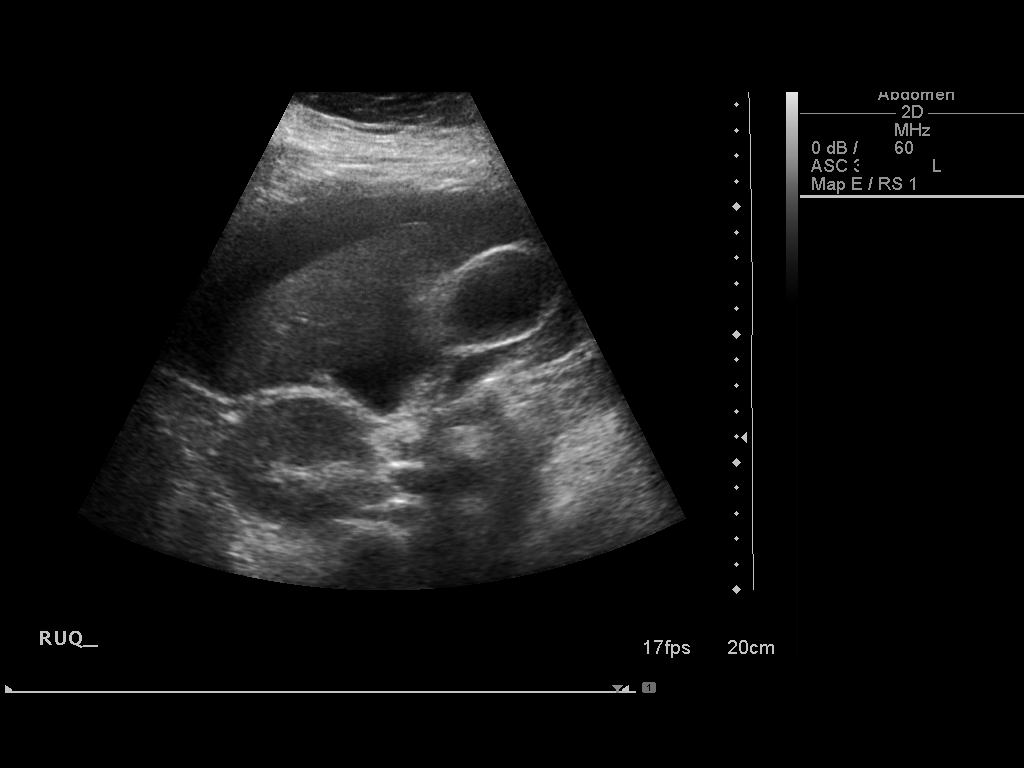
[im 2/5]
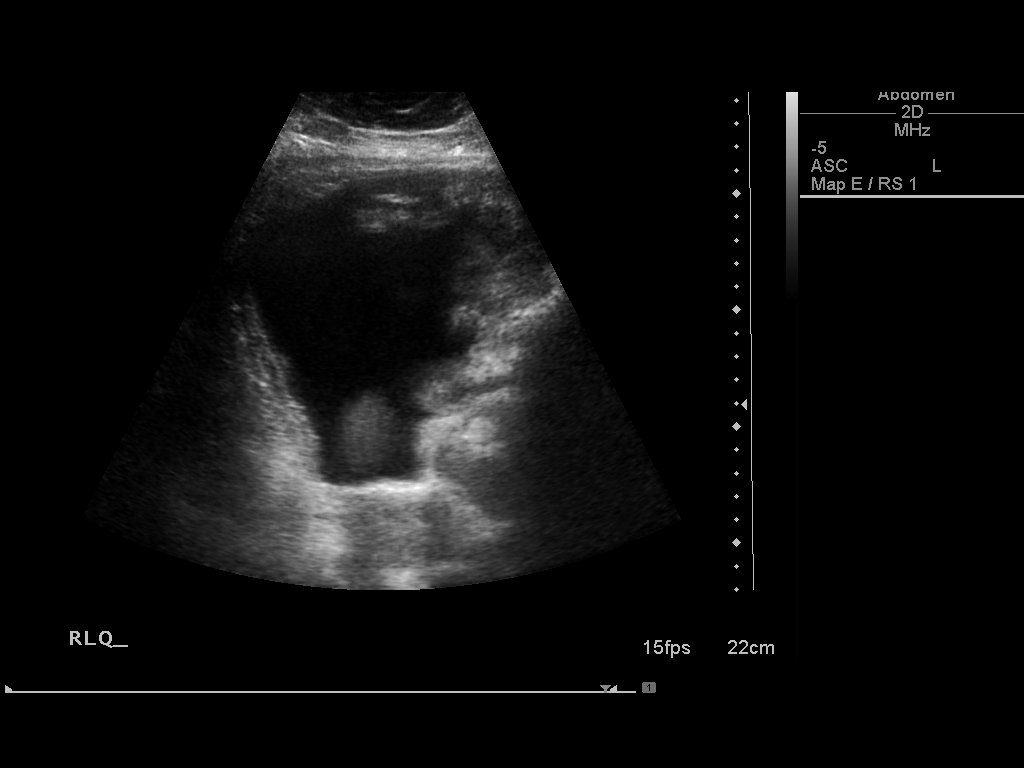
[im 3/5]
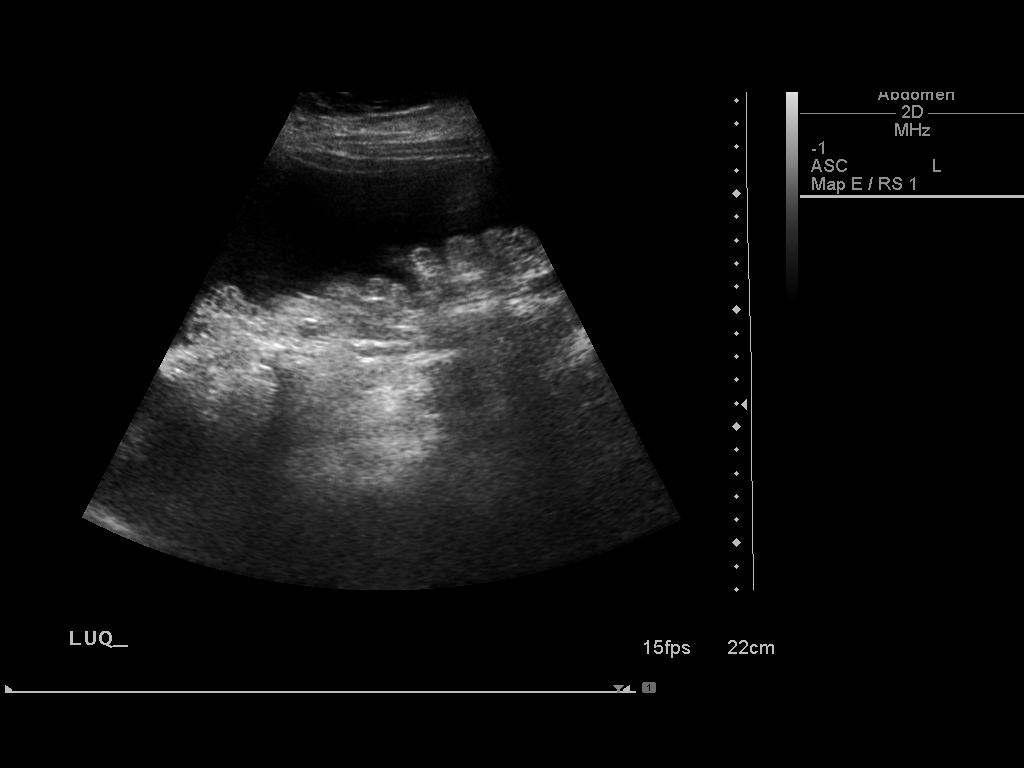
[im 4/5]
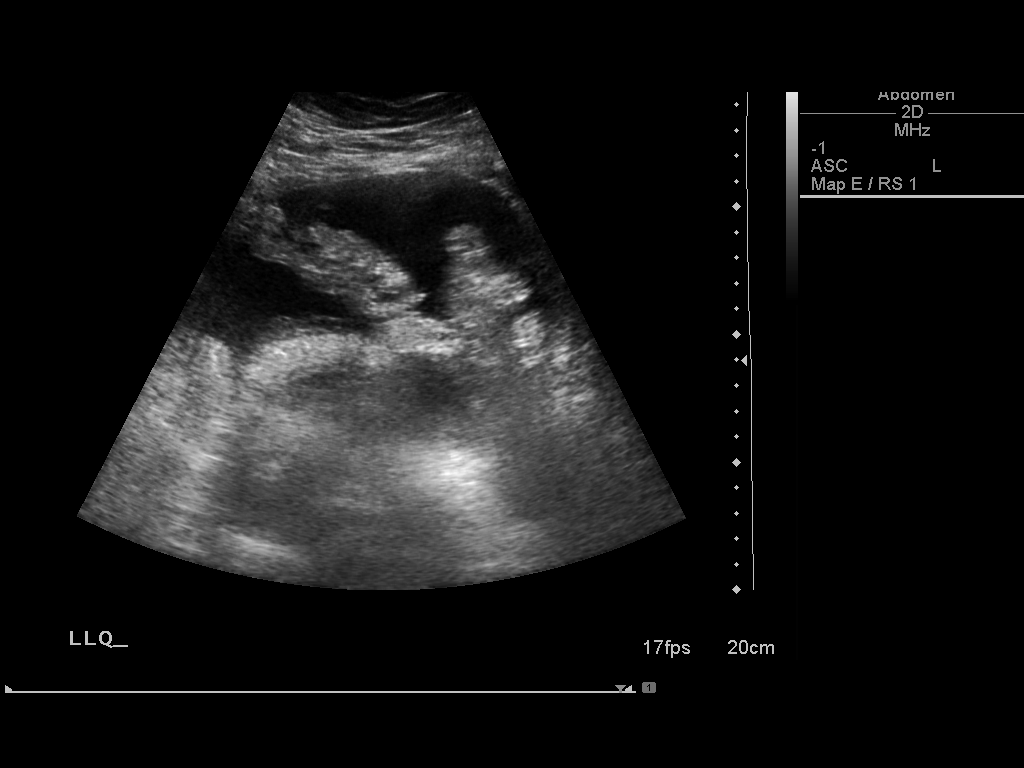
[im 5/5]
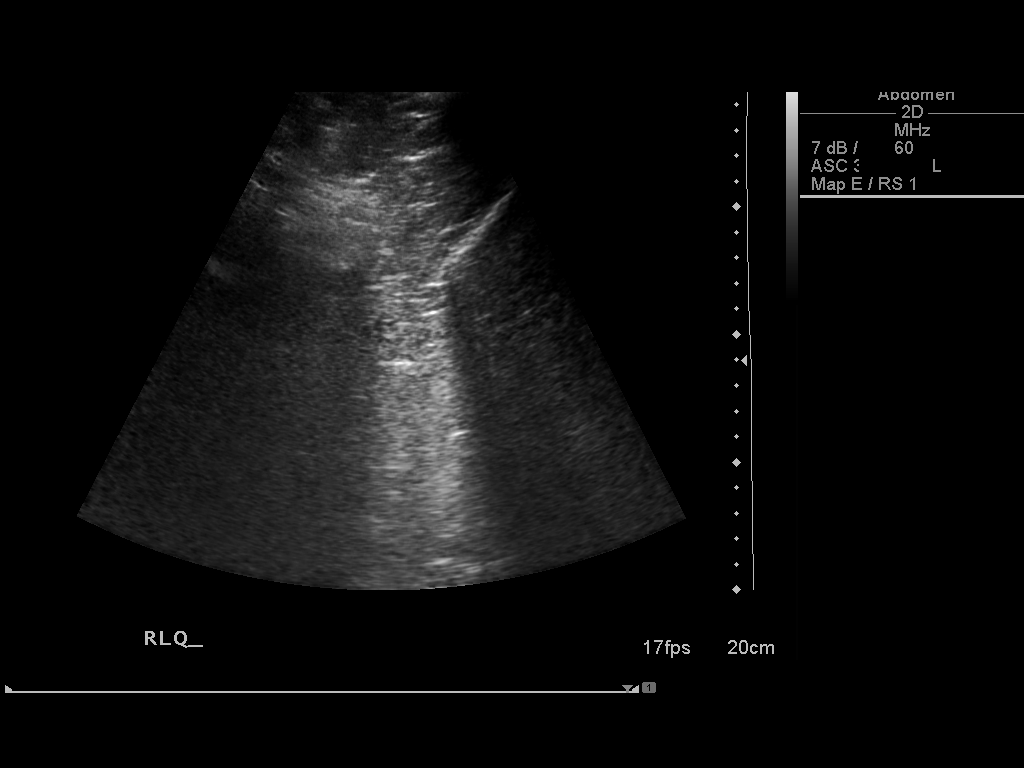

[5 of 5 positions shown; findings below may reference images not displayed]

An ultrasound guided paracentesis was thoroughly discussed with the
patient and questions answered.  The benefits, risks, alternatives
and complications were also discussed.  The patient understands and
wishes to proceed with the procedure.  Written consent was
obtained.

Ultrasound was performed to localize and mark an adequate pocket of
fluid in the right lateral quadrant of the abdomen.  The area was
then prepped and draped in the normal sterile fashion.  1%
Lidocaine was used for local anesthesia.  Under ultrasound guidance
a 19 gauge Yueh catheter was introduced.  Paracentesis was
performed.  The catheter was removed and a dressing applied.

Complications:  None
FINDINGS: A total of approximately 3.6 liters of dark yellow fluid
was removed.  A fluid sample was not sent for laboratory analysis.
IMPRESSION: Successful ultrasound guided paracentesis yielding 3.6 liters of
ascites.

Read by Milady Camilo
